# Patient Record
Sex: Female | Born: 1995 | Race: Black or African American | Hispanic: No | Marital: Single | Smoking: Never smoker
Health system: Southern US, Community
[De-identification: ages and names within clinical notes are randomized; demographics above are authoritative.]

## PROBLEM LIST (undated history)

## (undated) DIAGNOSIS — N760 Acute vaginitis: Secondary | ICD-10-CM

## (undated) DIAGNOSIS — I10 Essential (primary) hypertension: Secondary | ICD-10-CM

## (undated) DIAGNOSIS — B9689 Other specified bacterial agents as the cause of diseases classified elsewhere: Secondary | ICD-10-CM

## (undated) DIAGNOSIS — A599 Trichomoniasis, unspecified: Secondary | ICD-10-CM

## (undated) HISTORY — DX: Other specified bacterial agents as the cause of diseases classified elsewhere: B96.89

## (undated) HISTORY — DX: Other specified bacterial agents as the cause of diseases classified elsewhere: N76.0

## (undated) HISTORY — DX: Trichomoniasis, unspecified: A59.9

## (undated) HISTORY — DX: Essential (primary) hypertension: I10

## (undated) HISTORY — PX: NO PAST SURGERIES: SHX2092

---

## 2016-12-30 ENCOUNTER — Encounter (HOSPITAL_BASED_OUTPATIENT_CLINIC_OR_DEPARTMENT_OTHER): Payer: Self-pay | Admitting: *Deleted

## 2016-12-30 ENCOUNTER — Emergency Department (HOSPITAL_BASED_OUTPATIENT_CLINIC_OR_DEPARTMENT_OTHER)
Admission: EM | Admit: 2016-12-30 | Discharge: 2016-12-30 | Disposition: A | Discharge status: Home / Self Care | Payer: Self-pay | Attending: Dermatology | Admitting: Dermatology

## 2016-12-30 DIAGNOSIS — Z5321 Procedure and treatment not carried out due to patient leaving prior to being seen by health care provider: Secondary | ICD-10-CM | POA: Insufficient documentation

## 2016-12-30 DIAGNOSIS — M79644 Pain in right finger(s): Secondary | ICD-10-CM | POA: Insufficient documentation

## 2016-12-30 NOTE — ED Notes (Signed)
Pt seen driving away in her car from the dept.

## 2016-12-30 NOTE — ED Triage Notes (Signed)
Patient states she has had pain on the nail bed of the right middle finger for one week.  Started to have drainage today.

## 2021-09-22 ENCOUNTER — Other Ambulatory Visit: Payer: Self-pay

## 2021-09-22 ENCOUNTER — Encounter (HOSPITAL_BASED_OUTPATIENT_CLINIC_OR_DEPARTMENT_OTHER): Payer: Self-pay | Admitting: *Deleted

## 2021-09-22 ENCOUNTER — Emergency Department (HOSPITAL_BASED_OUTPATIENT_CLINIC_OR_DEPARTMENT_OTHER)
Admission: EM | Admit: 2021-09-22 | Discharge: 2021-09-22 | Disposition: A | Discharge status: Home / Self Care | Payer: No Typology Code available for payment source | Attending: Emergency Medicine | Admitting: Emergency Medicine

## 2021-09-22 ENCOUNTER — Emergency Department (HOSPITAL_BASED_OUTPATIENT_CLINIC_OR_DEPARTMENT_OTHER): Payer: No Typology Code available for payment source

## 2021-09-22 DIAGNOSIS — Z3A00 Weeks of gestation of pregnancy not specified: Secondary | ICD-10-CM | POA: Insufficient documentation

## 2021-09-22 DIAGNOSIS — O4690 Antepartum hemorrhage, unspecified, unspecified trimester: Secondary | ICD-10-CM

## 2021-09-22 DIAGNOSIS — B9689 Other specified bacterial agents as the cause of diseases classified elsewhere: Secondary | ICD-10-CM

## 2021-09-22 DIAGNOSIS — N760 Acute vaginitis: Secondary | ICD-10-CM | POA: Insufficient documentation

## 2021-09-22 DIAGNOSIS — O209 Hemorrhage in early pregnancy, unspecified: Secondary | ICD-10-CM | POA: Insufficient documentation

## 2021-09-22 DIAGNOSIS — N939 Abnormal uterine and vaginal bleeding, unspecified: Secondary | ICD-10-CM

## 2021-09-22 LAB — URINALYSIS, ROUTINE W REFLEX MICROSCOPIC
Bilirubin Urine: NEGATIVE
Glucose, UA: NEGATIVE mg/dL
Nitrite: NEGATIVE
Protein, ur: 100 mg/dL — AB
Specific Gravity, Urine: 1.03 (ref 1.005–1.030)
pH: 6 (ref 5.0–8.0)

## 2021-09-22 LAB — COMPREHENSIVE METABOLIC PANEL
ALT: 12 U/L (ref 0–44)
AST: 14 U/L — ABNORMAL LOW (ref 15–41)
Albumin: 3.8 g/dL (ref 3.5–5.0)
Alkaline Phosphatase: 47 U/L (ref 38–126)
Anion gap: 5 (ref 5–15)
BUN: 11 mg/dL (ref 6–20)
CO2: 26 mmol/L (ref 22–32)
Calcium: 8.3 mg/dL — ABNORMAL LOW (ref 8.9–10.3)
Chloride: 108 mmol/L (ref 98–111)
Creatinine, Ser: 0.84 mg/dL (ref 0.44–1.00)
GFR, Estimated: >60 mL/min (ref >60)
Glucose, Bld: 87 mg/dL (ref 70–99)
Potassium: 3.4 mmol/L — ABNORMAL LOW (ref 3.5–5.1)
Sodium: 139 mmol/L (ref 135–145)
Total Bilirubin: 0.3 mg/dL (ref 0.3–1.2)
Total Protein: 7.3 g/dL (ref 6.5–8.1)

## 2021-09-22 LAB — CBC WITH DIFFERENTIAL/PLATELET
Abs Immature Granulocytes: 0.03 10*3/uL (ref 0.00–0.07)
Basophils Absolute: 0 10*3/uL (ref 0.0–0.1)
Basophils Relative: 0 %
Eosinophils Absolute: 0.2 10*3/uL (ref 0.0–0.5)
Eosinophils Relative: 2 %
HCT: 37.2 % (ref 36.0–46.0)
Hemoglobin: 12.4 g/dL (ref 12.0–15.0)
Immature Granulocytes: 0 %
Lymphocytes Relative: 35 %
Lymphs Abs: 2.9 10*3/uL (ref 0.7–4.0)
MCH: 28.1 pg (ref 26.0–34.0)
MCHC: 33.3 g/dL (ref 30.0–36.0)
MCV: 84.4 fL (ref 80.0–100.0)
Monocytes Absolute: 0.6 10*3/uL (ref 0.1–1.0)
Monocytes Relative: 7 %
Neutro Abs: 4.7 10*3/uL (ref 1.7–7.7)
Neutrophils Relative %: 56 %
Platelets: 280 10*3/uL (ref 150–400)
RBC: 4.41 MIL/uL (ref 3.87–5.11)
RDW: 13.1 % (ref 11.5–15.5)
WBC: 8.4 10*3/uL (ref 4.0–10.5)
nRBC: 0 % (ref 0.0–0.2)

## 2021-09-22 LAB — PREGNANCY, URINE: Preg Test, Ur: POSITIVE — AB

## 2021-09-22 LAB — URINALYSIS, MICROSCOPIC (REFLEX): RBC / HPF: >50 RBC/hpf (ref 0–5)

## 2021-09-22 LAB — HCG, QUANTITATIVE, PREGNANCY: hCG, Beta Chain, Quant, S: 543 m[IU]/mL — ABNORMAL HIGH (ref <5)

## 2021-09-22 LAB — ABO/RH: ABO/RH(D): B POS

## 2021-09-22 LAB — WET PREP, GENITAL
Sperm: NONE SEEN
Trich, Wet Prep: NONE SEEN
Yeast Wet Prep HPF POC: NONE SEEN

## 2021-09-22 MED ORDER — METRONIDAZOLE 500 MG PO TABS: 500.0000 mg | ORAL_TABLET | Freq: Two times a day (BID) | ORAL | 0 refills | Status: AC

## 2021-09-22 NOTE — ED Notes (Signed)
ED Provider at bedside. 

## 2021-09-22 NOTE — Discharge Instructions (Addendum)
You were seen in the emergency department a day for vaginal bleeding.  While you are here you had a positive pregnancy test.  Your hCG which is a indication of how far along you are in pregnancy showed that you are anywhere between 3 and [redacted] weeks pregnant.  We obtained an ultrasound which did not show an intrauterine pregnancy but this is not abnormal given the hCG level.  I spoke with OB/GYN who instructs that you return to OB/GYN on Monday to have a repeat beta-hCG drawn.  It is imperative that you go to this appointment.  I have put the name of your current OB/GYN Annye Rusk and your discharge paperwork for you to call.  As we discussed at the bedside its important for you return to emergency department if you have lightheadedness or dizziness, shortness of breath with this ongoing bleeding.  Please also return to the emergency department if you are soaking through more than 1 pad an hour for a few hours while at home.  You may also return to emergency department for any reason.

## 2021-09-22 NOTE — ED Provider Notes (Signed)
[facility] EMERGENCY DEPARTMENT Provider Note   CSN: [id number] Arrival date & time: 09/22/21  1523     History Chief Complaint  Patient presents with   Vaginal Bleeding    Joy Jordan is a 25 y.o. female.  No significant past medical history presents emergency department with vaginal bleeding.  States that she had pink spotting on 09/03/2021.  States that she started her period the following week of 09/10/2021.  She is unsure about which she states she started last week.  She states that her periods normally are 7 to 10 days however she is had continued bleeding.  She states that on Monday she began having bad abdominal cramping.  States that she took ibuprofen with relief of symptoms.  She states then on Tuesday she had a few large blood clots followed by smaller blood clots which resolved on Wednesday and then recurred on Thursday.  States that her last menstrual period before this was in August.  States that her last intercourse was at the beginning of August.  She also endorses having odor from her vagina which is new as of the past few days.  States that she is currently using about 6 pads a day.  She denies current abdominal pain or cramping, lightheadedness, dizziness, fevers, nausea or vomiting.  No prior pregnancies.   Vaginal Bleeding Associated symptoms: no abdominal pain, no dizziness, no dysuria and no fever       History reviewed. No pertinent past medical history.  There are no problems to display for this patient.   History reviewed. No pertinent surgical history.   OB History   No obstetric history on file.     No family history on file.  Social History   Tobacco Use   Smoking status: Never   Smokeless tobacco: Never  Substance Use Topics   Alcohol use: No   Drug use: No    Home Medications Prior to Admission medications   Not on File    Allergies    Patient has no known allergies.  Review of Systems   Review of Systems   Constitutional:  Negative for fever.  Gastrointestinal:  Negative for abdominal pain and vomiting.  Genitourinary:  Positive for pelvic pain and vaginal bleeding. Negative for dysuria.  Neurological:  Negative for dizziness and light-headedness.  Hematological:  Does not bruise/bleed easily.  All other systems reviewed and are negative.  Physical Exam Updated Vital Signs BP 139/73 (BP Location: Right Arm)   Pulse 97   Temp 98.4 F (36.9 C) (Oral)   Resp 18   Ht 5\' 3"  (1.6 m)   Wt 131.5 kg   LMP 09/15/2021   SpO2 100%   BMI 51.37 kg/m   Physical Exam Vitals and nursing note reviewed. Exam conducted with a chaperone present.  Constitutional:      General: She is not in acute distress.    Appearance: Normal appearance. She is not toxic-appearing.  HENT:     Head: Normocephalic and atraumatic.  Eyes:     General: No scleral icterus.    Extraocular Movements: Extraocular movements intact.     Pupils: Pupils are equal, round, and reactive to light.  Cardiovascular:     Rate and Rhythm: Regular rhythm. Tachycardia present.     Pulses: Normal pulses.     Heart sounds: Normal heart sounds. No murmur heard. Pulmonary:     Effort: Pulmonary effort is normal. No respiratory distress.     Breath sounds: Normal breath sounds.  Abdominal:     General: Bowel sounds are normal.     Palpations: Abdomen is soft.     Tenderness: There is no abdominal tenderness.  Genitourinary:    General: Normal vulva.     Exam position: Lithotomy position.     Vagina: Bleeding present. No erythema, tenderness or lesions.     Cervix: Cervical bleeding present. No lesion.  Musculoskeletal:     Cervical back: Normal range of motion.     Right lower leg: No edema.     Left lower leg: No edema.  Skin:    General: Skin is warm and dry.     Capillary Refill: Capillary refill takes less than 2 seconds.  Neurological:     General: No focal deficit present.     Mental Status: She is alert and oriented  to person, place, and time. Mental status is at baseline.  Psychiatric:        Mood and Affect: Mood normal.        Behavior: Behavior normal.    ED Results / Procedures / Treatments   Labs (all labs ordered are listed, but only abnormal results are displayed) Labs Reviewed  WET PREP, GENITAL - Abnormal; Notable for the following components:      Result Value   Clue Cells Wet Prep HPF POC PRESENT (*)    WBC, Wet Prep HPF POC FEW (*)    All other components within normal limits  PREGNANCY, URINE - Abnormal; Notable for the following components:   Preg Test, Ur POSITIVE (*)    All other components within normal limits  COMPREHENSIVE METABOLIC PANEL - Abnormal; Notable for the following components:   Potassium 3.4 (*)    Calcium 8.3 (*)    AST 14 (*)    All other components within normal limits  HCG, QUANTITATIVE, PREGNANCY - Abnormal; Notable for the following components:   hCG, Beta Chain, Quant, S 543 (*)    All other components within normal limits  URINALYSIS, ROUTINE W REFLEX MICROSCOPIC - Abnormal; Notable for the following components:   Color, Urine RED (*)    APPearance CLOUDY (*)    Hgb urine dipstick LARGE (*)    Ketones, ur TRACE (*)    Protein, ur 100 (*)    Leukocytes,Ua SMALL (*)    All other components within normal limits  URINALYSIS, MICROSCOPIC (REFLEX) - Abnormal; Notable for the following components:   Bacteria, UA MANY (*)    All other components within normal limits  CBC WITH DIFFERENTIAL/PLATELET  ABO/RH  GC/CHLAMYDIA PROBE AMP ([place]) NOT AT [facility]   EKG None  Radiology US OB LESS THAN 14 WEEKS WITH OB TRANSVAGINAL  Result Date: 09/22/2021 CLINICAL DATA:  Vaginal bleeding and cramping for 1 week, beta HCG 543 EXAM: OBSTETRIC <14 WK [place] AND TRANSVAGINAL OB US TECHNIQUE: Both transabdominal and transvaginal ultrasound examinations were performed for complete evaluation of the gestation as well as the maternal uterus, adnexal regions, and pelvic  cul-de-sac. Transvaginal technique was performed to assess early pregnancy. COMPARISON:  None. FINDINGS: Intrauterine gestational sac: None Yolk sac:  Not Visualized. Embryo:  Not Visualized. Maternal uterus/adnexae: The uterus is anteverted without gross abnormality. Endometrium measures 12 mm in thickness. Ovaries are not well visualized. No free fluid. IMPRESSION: 1. No evidence of intrauterine pregnancy at this time, not unexpected given low beta HCG level. Serial beta HCG measurements and follow-up ultrasound may be needed to document a live intrauterine pregnancy. 2. Otherwise unremarkable exam. Electronically Signed  By: Sharlet Salina M.D.   On: 09/22/2021 18:57    Procedures Procedures   Medications Ordered in ED Medications - No data to display  ED Course  I have reviewed the triage vital signs and the nursing notes.  Pertinent labs & imaging results that were available during my care of the patient were reviewed by me and considered in my medical decision making (see chart for details).    MDM Rules/Calculators/A&P 25 year old female presents emergency department with vaginal bleeding.  Initial urine pregnancy positive. Beta-hCG 543. Obtain transvaginal ultrasound which showed no evidence of intrauterine pregnancy at this time.  This is not unexpected given hCG level.  CBC with hemoglobin of 12.4, only previous hemoglobin was 2 years ago which was 11.8.  However she is not hemodynamically unstable. Pelvic exam with blood in the vaginal vault.  Cervical os closed.  Swabbed for GC/chlamydia and wet prep.  Wet prep with clue cells so will treat for bacterial vaginosis. Rh+ so will not give RhoGAM as it is not indicated. Spoke with Dr. Para March with [facility] who advises that she follow-up with her OB/GYN on Monday for repeat beta-hCG measurement. I have discussed all the findings with the patient and answered all of her questions at this time I advised her that if  she is soaking through more than 1 pad an hour for the few hours to represent to the emergency department.  She is also advised to present to the emergency department if she begins to have lightheadedness or dizziness, shortness of breath with her ongoing bleeding.    At this time she is safe for discharge.  She has strict follow-up precautions to the emergency department and OB/GYN. Final Clinical Impression(s) / ED Diagnoses Final diagnoses:  BV (bacterial vaginosis)  Vaginal bleeding in pregnancy    Rx / DC Orders ED Discharge Orders          Ordered    metroNIDAZOLE (FLAGYL) 500 MG tablet  2 times daily        09/22/21 1953             Cristopher Peru, PA-C 09/22/21 Salome Holmes, MD 09/25/21 1332

## 2021-09-22 NOTE — ED Triage Notes (Signed)
Vaginal bleeding and cramping for a week. She is not sure if this is an abnormal menses for her or if she could be pregnant and having a miscarriage.

## 2021-09-25 LAB — GC/CHLAMYDIA PROBE AMP (~~LOC~~) NOT AT ARMC
Chlamydia: NEGATIVE
Comment: NEGATIVE
Comment: NORMAL
Neisseria Gonorrhea: NEGATIVE

## 2022-06-13 ENCOUNTER — Encounter: Payer: Medicaid Other | Admitting: Family Medicine

## 2022-06-20 ENCOUNTER — Encounter: Payer: Self-pay | Admitting: General Practice

## 2022-06-21 ENCOUNTER — Encounter: Payer: Self-pay | Admitting: General Practice

## 2022-09-28 IMAGING — US US OB < 14 WEEKS - US OB TV
1 series · 14 of 28 positions shown · non-contrast
Comparison: None.

CLINICAL DATA: Vaginal bleeding and cramping for 1 week, beta HCG
543

EXAM:
OBSTETRIC <14 WK US AND TRANSVAGINAL OB US
TECHNIQUE: Both transabdominal and transvaginal ultrasound examinations were
performed for complete evaluation of the gestation as well as the
maternal uterus, adnexal regions, and pelvic cul-de-sac.
Transvaginal technique was performed to assess early pregnancy.

[Series 1: us ob < 14 weeks - us ob tv · 45 acquisitions, 14 frames shown]
[im 2/45]
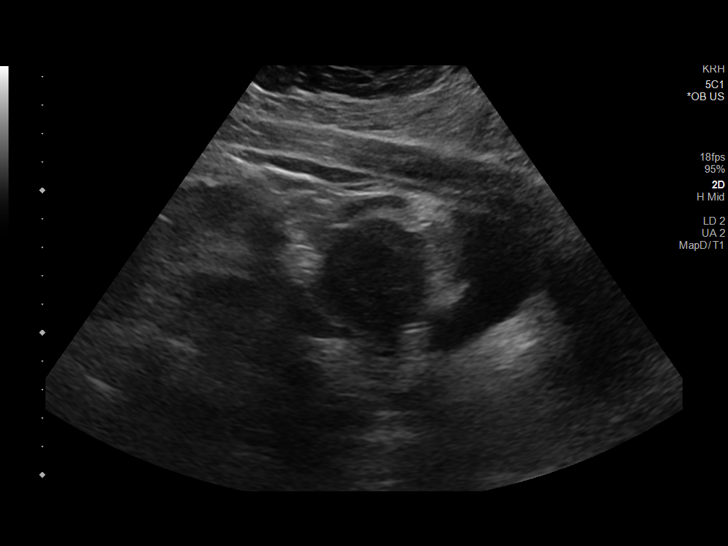
[im 5/45]
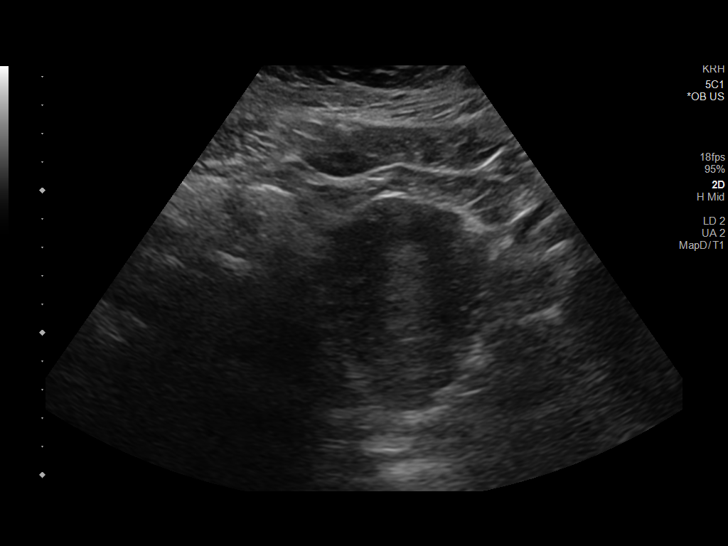
[im 9/45]
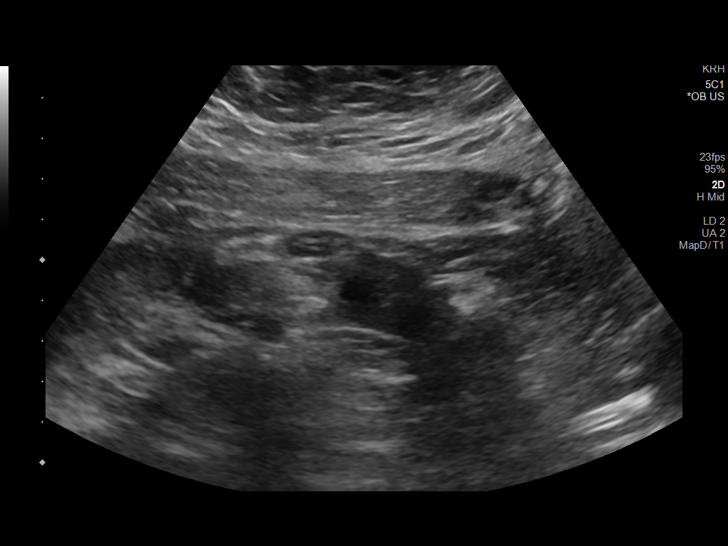
[im 12/45]
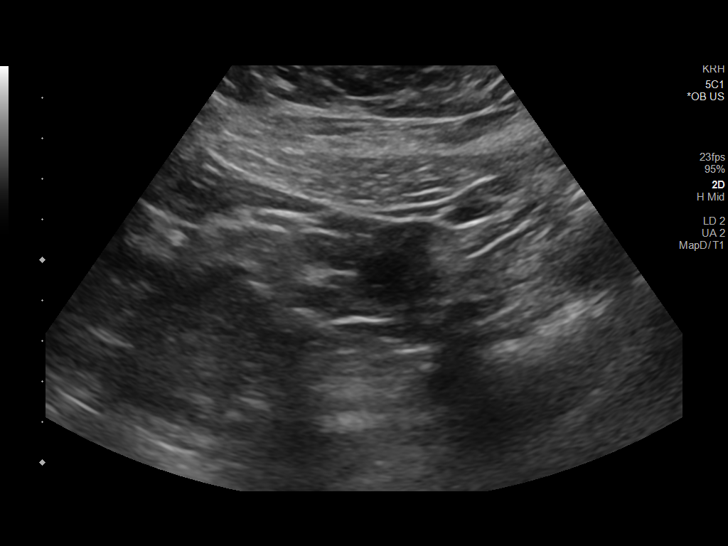
[im 15/45]
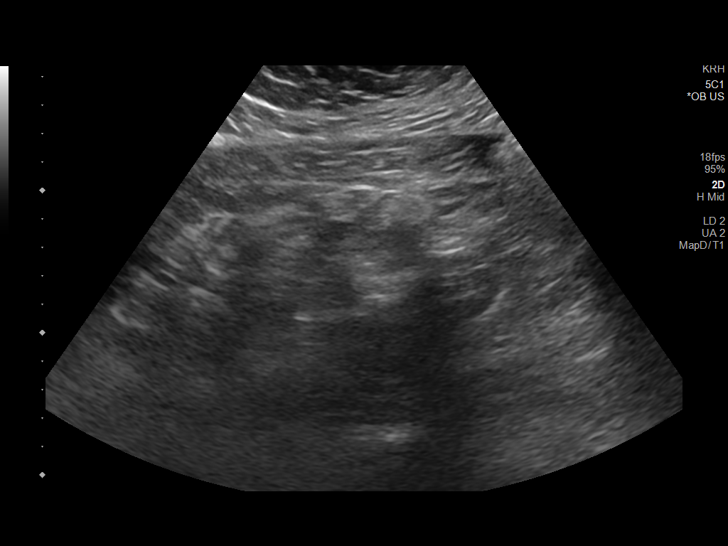
[im 18/45]
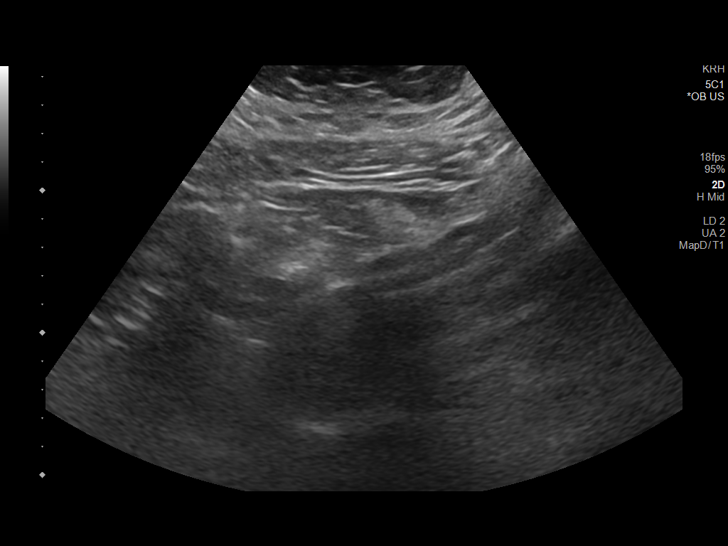
[im 22/45]
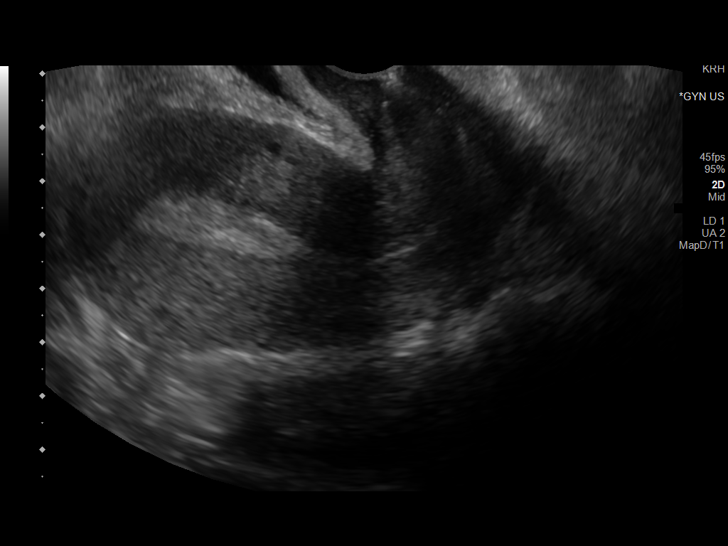
[im 25/45]
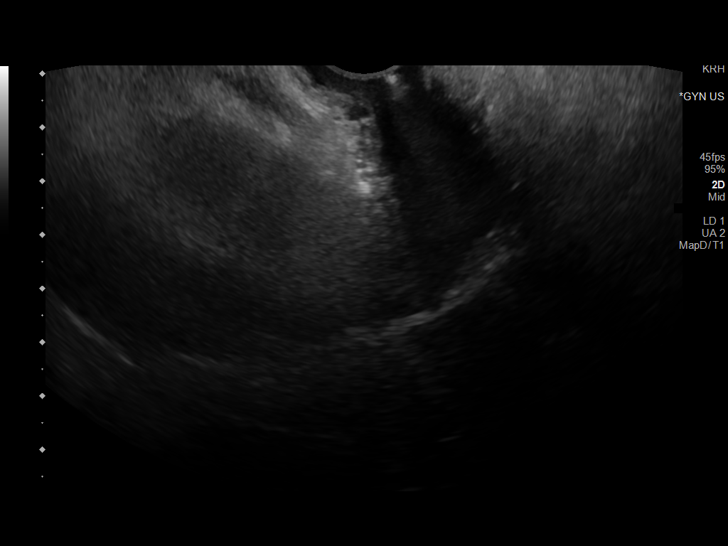
[im 28/45]
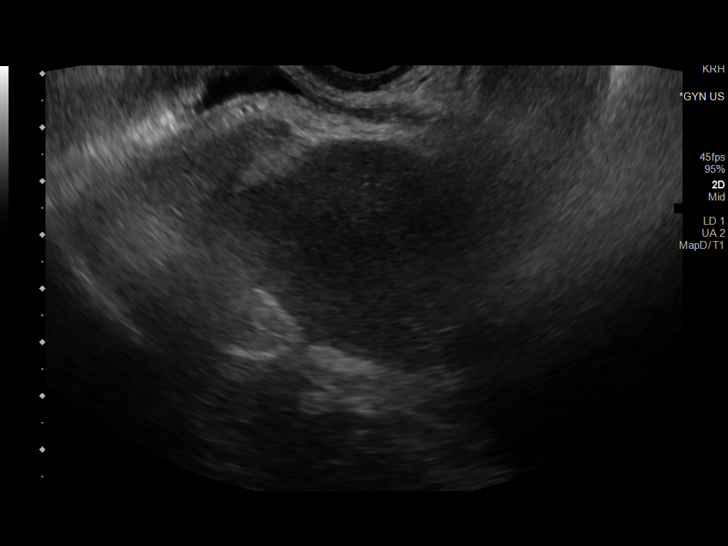
[im 31/45]
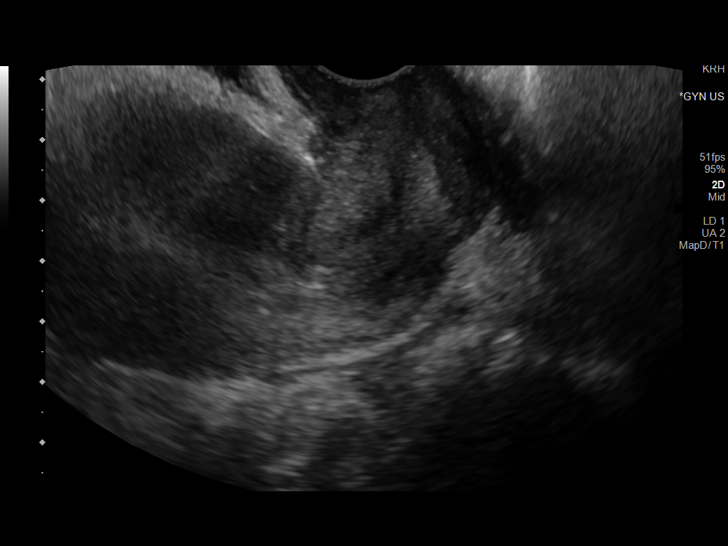
[im 35/45]
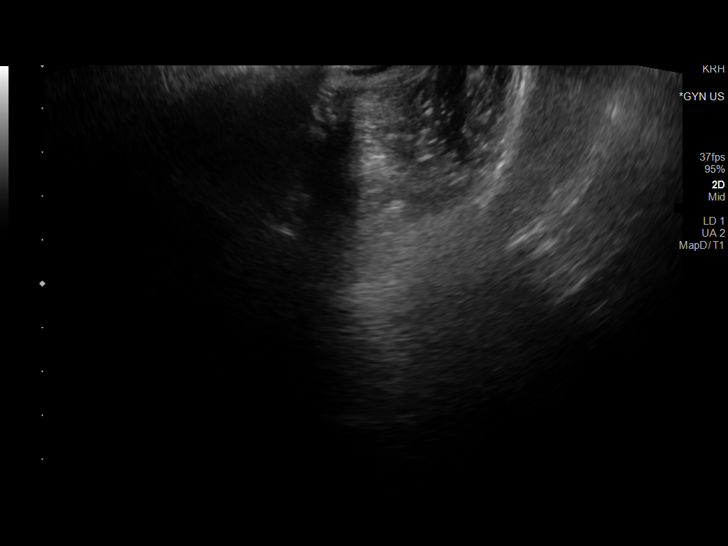
[im 38/45]
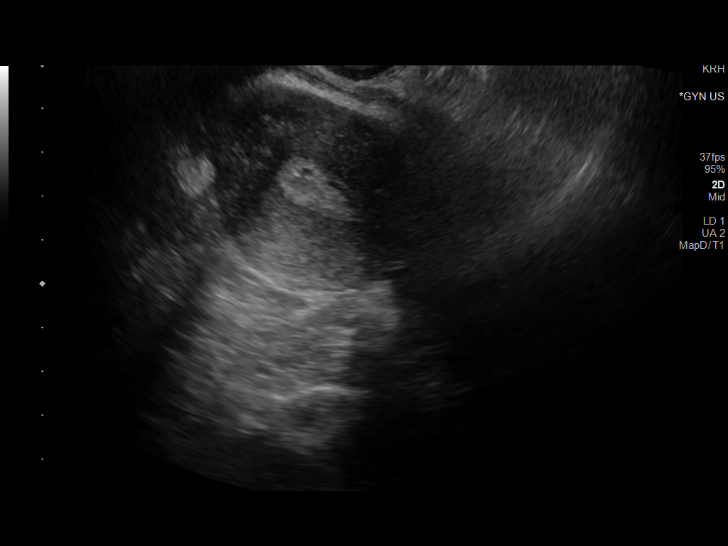
[im 41/45]
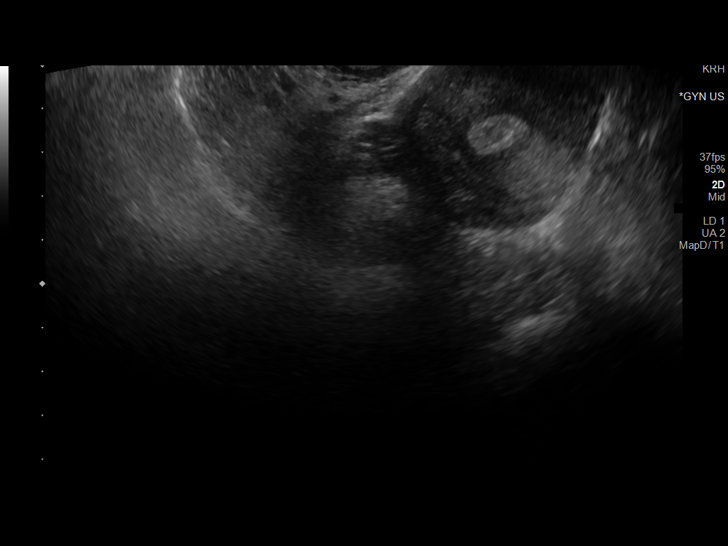
[im 45/45]
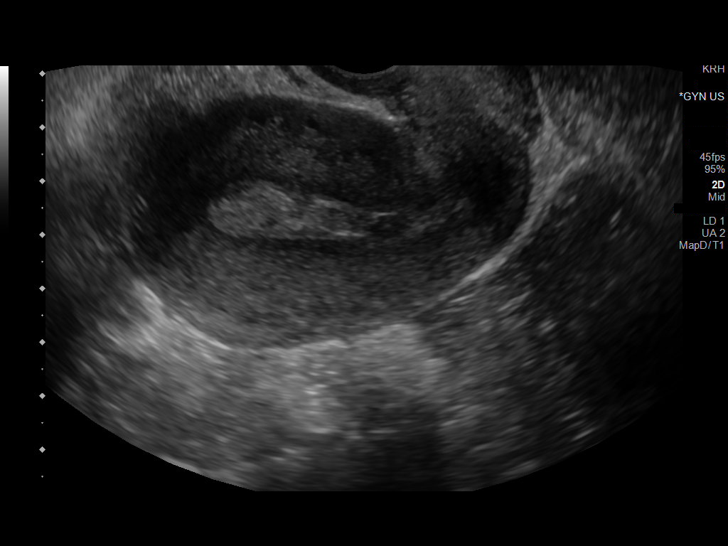

[14 of 28 positions shown; findings below may reference images not displayed]

FINDINGS: Intrauterine gestational sac: None

Yolk sac:  Not Visualized.

Embryo:  Not Visualized.

Maternal uterus/adnexae: The uterus is anteverted without gross
abnormality. Endometrium measures 12 mm in thickness. Ovaries are
not well visualized. No free fluid.
IMPRESSION: 1. No evidence of intrauterine pregnancy at this time, not
unexpected given low beta HCG level. Serial beta HCG measurements
and follow-up ultrasound may be needed to document a live
intrauterine pregnancy.
2. Otherwise unremarkable exam.

## 2022-10-01 LAB — OB RESULTS CONSOLE HEPATITIS B SURFACE ANTIGEN: Hepatitis B Surface Ag: NEGATIVE

## 2022-10-01 LAB — HEPATITIS C ANTIBODY: HCV Ab: NEGATIVE

## 2022-10-01 LAB — OB RESULTS CONSOLE HIV ANTIBODY (ROUTINE TESTING): HIV: NONREACTIVE

## 2022-10-01 LAB — OB RESULTS CONSOLE RUBELLA ANTIBODY, IGM: Rubella: IMMUNE

## 2022-10-10 LAB — OB RESULTS CONSOLE RPR: RPR: NONREACTIVE

## 2022-12-24 NOTE — L&D Delivery Note (Signed)
Operative Delivery Note At 12:53 AM a viable and healthy female was delivered via Vaginal, Vacuum Investment banker, operational).  Presentation: vertex; Position: Left,, Occiput,, Anterior; Station: +2.  Verbal consent: obtained from patient.  Risks and benefits discussed in detail.  Risks include, but are not limited to the risks of anesthesia, bleeding, infection, damage to maternal tissues, fetal cephalhematoma.  There is also the risk of inability to effect vaginal delivery of the head, or shoulder dystocia that cannot be resolved by established maneuvers, leading to the need for emergency cesarean section.  Indication: repetitive deep variable deceleration APGAR: 8, 9; weight pending .   Placenta status:spontaneous intact not sent , .   Cord:  with the following complications: .  Cord pH: n/a  Anesthesia:  epidural Instruments: mushroom vacuum Episiotomy: None Lacerations: 2nd degree;Sulcus;Perineal; bilateral Vaginal Suture Repair: 3.0 chromic Est. Blood Loss (mL): 1064  Mom to postpartum.  Baby to Couplet care / Skin to Skin.  Macedonio Scallon A Mariell Nester 04/23/2023, 1:49 AM

## 2023-02-21 ENCOUNTER — Other Ambulatory Visit: Payer: Self-pay | Admitting: Obstetrics and Gynecology

## 2023-02-21 DIAGNOSIS — Z363 Encounter for antenatal screening for malformations: Secondary | ICD-10-CM

## 2023-02-22 ENCOUNTER — Encounter: Payer: Self-pay | Admitting: *Deleted

## 2023-02-25 ENCOUNTER — Ambulatory Visit: Payer: BC Managed Care – PPO | Admitting: *Deleted

## 2023-02-25 ENCOUNTER — Encounter: Payer: Self-pay | Admitting: *Deleted

## 2023-02-25 ENCOUNTER — Ambulatory Visit: Payer: BC Managed Care – PPO | Attending: Obstetrics and Gynecology

## 2023-02-25 VITALS — BP 121/83 | HR 116

## 2023-02-25 DIAGNOSIS — O10913 Unspecified pre-existing hypertension complicating pregnancy, third trimester: Secondary | ICD-10-CM | POA: Diagnosis present

## 2023-02-25 DIAGNOSIS — Z363 Encounter for antenatal screening for malformations: Secondary | ICD-10-CM | POA: Insufficient documentation

## 2023-03-28 LAB — OB RESULTS CONSOLE GC/CHLAMYDIA
Chlamydia: NEGATIVE
Neisseria Gonorrhea: NEGATIVE

## 2023-04-02 LAB — OB RESULTS CONSOLE GBS: GBS: NEGATIVE

## 2023-04-22 ENCOUNTER — Encounter (HOSPITAL_COMMUNITY): Payer: Self-pay | Admitting: Obstetrics and Gynecology

## 2023-04-22 ENCOUNTER — Inpatient Hospital Stay (HOSPITAL_COMMUNITY): Payer: BC Managed Care – PPO | Admitting: Anesthesiology

## 2023-04-22 ENCOUNTER — Inpatient Hospital Stay (HOSPITAL_COMMUNITY)
Admission: AD | Admit: 2023-04-22 | Discharge: 2023-04-25 | DRG: 807 | Disposition: A | Discharge status: Home / Self Care | Payer: BC Managed Care – PPO | Attending: Obstetrics and Gynecology | Admitting: Obstetrics and Gynecology

## 2023-04-22 ENCOUNTER — Other Ambulatory Visit: Payer: Self-pay

## 2023-04-22 DIAGNOSIS — O9902 Anemia complicating childbirth: Principal | ICD-10-CM | POA: Diagnosis present

## 2023-04-22 DIAGNOSIS — Z3A38 38 weeks gestation of pregnancy: Secondary | ICD-10-CM | POA: Diagnosis not present

## 2023-04-22 DIAGNOSIS — D509 Iron deficiency anemia, unspecified: Secondary | ICD-10-CM | POA: Diagnosis present

## 2023-04-22 DIAGNOSIS — O99214 Obesity complicating childbirth: Secondary | ICD-10-CM | POA: Diagnosis present

## 2023-04-22 DIAGNOSIS — O26893 Other specified pregnancy related conditions, third trimester: Secondary | ICD-10-CM | POA: Diagnosis present

## 2023-04-22 LAB — URINALYSIS, ROUTINE W REFLEX MICROSCOPIC
Bilirubin Urine: NEGATIVE
Glucose, UA: NEGATIVE mg/dL
Ketones, ur: NEGATIVE mg/dL
Nitrite: NEGATIVE
Protein, ur: 30 mg/dL — AB
Specific Gravity, Urine: 1.01 (ref 1.005–1.030)
pH: 7 (ref 5.0–8.0)

## 2023-04-22 LAB — CBC
HCT: 29.5 % — ABNORMAL LOW (ref 36.0–46.0)
Hemoglobin: 9.5 g/dL — ABNORMAL LOW (ref 12.0–15.0)
MCH: 25.3 pg — ABNORMAL LOW (ref 26.0–34.0)
MCHC: 32.2 g/dL (ref 30.0–36.0)
MCV: 78.5 fL — ABNORMAL LOW (ref 80.0–100.0)
Platelets: 282 10*3/uL (ref 150–400)
RBC: 3.76 MIL/uL — ABNORMAL LOW (ref 3.87–5.11)
RDW: 14.3 % (ref 11.5–15.5)
WBC: 12 10*3/uL — ABNORMAL HIGH (ref 4.0–10.5)
nRBC: 0.2 % (ref 0.0–0.2)

## 2023-04-22 LAB — TYPE AND SCREEN
ABO/RH(D): B POS
Antibody Screen: NEGATIVE

## 2023-04-22 LAB — POCT FERN TEST: POCT Fern Test: POSITIVE

## 2023-04-22 LAB — RPR: RPR Ser Ql: NONREACTIVE

## 2023-04-22 MED ORDER — OXYTOCIN 10 UNIT/ML IJ SOLN: 10.0000 [IU] | Freq: Once | INTRAMUSCULAR | Status: DC

## 2023-04-22 MED ORDER — EPHEDRINE 5 MG/ML INJ: 10.0000 mg | INTRAVENOUS | Status: DC | PRN

## 2023-04-22 MED ORDER — SOD CITRATE-CITRIC ACID 500-334 MG/5ML PO SOLN: 30.0000 mL | ORAL | Status: DC | PRN

## 2023-04-22 MED ORDER — LIDOCAINE HCL (PF) 1 % IJ SOLN
30.0000 mL | INTRAMUSCULAR | Status: AC | PRN
  Administered 2023-04-23: 30 mL via SUBCUTANEOUS
  Filled 2023-04-22: qty 30

## 2023-04-22 MED ORDER — LIDOCAINE HCL (PF) 1 % IJ SOLN
INTRAMUSCULAR | Status: DC | PRN
  Administered 2023-04-22 (×2): 5 mL via EPIDURAL

## 2023-04-22 MED ORDER — PHENYLEPHRINE 80 MCG/ML (10ML) SYRINGE FOR IV PUSH (FOR BLOOD PRESSURE SUPPORT)
80.0000 ug | PREFILLED_SYRINGE | INTRAVENOUS | Status: DC | PRN
  Filled 2023-04-22: qty 10

## 2023-04-22 MED ORDER — OXYTOCIN BOLUS FROM INFUSION
333.0000 mL | Freq: Once | INTRAVENOUS | Status: AC
  Administered 2023-04-23: 333 mL via INTRAVENOUS

## 2023-04-22 MED ORDER — LACTATED RINGERS IV SOLN
500.0000 mL | Freq: Once | INTRAVENOUS | Status: AC
  Administered 2023-04-22: 500 mL via INTRAVENOUS

## 2023-04-22 MED ORDER — LACTATED RINGERS IV SOLN: 500.0000 mL | INTRAVENOUS | Status: DC | PRN

## 2023-04-22 MED ORDER — MISOPROSTOL 50MCG HALF TABLET
50.0000 ug | ORAL_TABLET | Freq: Once | ORAL | Status: AC
  Administered 2023-04-22: 50 ug via ORAL
  Filled 2023-04-22: qty 1

## 2023-04-22 MED ORDER — TERBUTALINE SULFATE 1 MG/ML IJ SOLN
0.2500 mg | Freq: Once | INTRAMUSCULAR | Status: AC | PRN
  Administered 2023-04-22: 0.25 mg via SUBCUTANEOUS
  Filled 2023-04-22: qty 1

## 2023-04-22 MED ORDER — OXYTOCIN-SODIUM CHLORIDE 30-0.9 UT/500ML-% IV SOLN
1.0000 m[IU]/min | INTRAVENOUS | Status: DC
  Administered 2023-04-22: 2 m[IU]/min via INTRAVENOUS
  Filled 2023-04-22: qty 500

## 2023-04-22 MED ORDER — FENTANYL CITRATE (PF) 100 MCG/2ML IJ SOLN
100.0000 ug | INTRAMUSCULAR | Status: DC | PRN
  Administered 2023-04-22: 100 ug via INTRAVENOUS
  Filled 2023-04-22: qty 2

## 2023-04-22 MED ORDER — OXYCODONE-ACETAMINOPHEN 5-325 MG PO TABS: 2.0000 | ORAL_TABLET | ORAL | Status: DC | PRN

## 2023-04-22 MED ORDER — MISOPROSTOL 25 MCG QUARTER TABLET
25.0000 ug | ORAL_TABLET | Freq: Once | ORAL | Status: AC
  Administered 2023-04-22: 25 ug via VAGINAL
  Filled 2023-04-22: qty 1

## 2023-04-22 MED ORDER — TERBUTALINE SULFATE 1 MG/ML IJ SOLN: 0.2500 mg | Freq: Once | INTRAMUSCULAR | Status: DC | PRN

## 2023-04-22 MED ORDER — LACTATED RINGERS AMNIOINFUSION
INTRAVENOUS | Status: DC
  Administered 2023-04-22: 150 mL/h via INTRAUTERINE

## 2023-04-22 MED ORDER — DIPHENHYDRAMINE HCL 50 MG/ML IJ SOLN: 12.5000 mg | INTRAMUSCULAR | Status: DC | PRN

## 2023-04-22 MED ORDER — OXYCODONE-ACETAMINOPHEN 5-325 MG PO TABS: 1.0000 | ORAL_TABLET | ORAL | Status: DC | PRN

## 2023-04-22 MED ORDER — PHENYLEPHRINE 80 MCG/ML (10ML) SYRINGE FOR IV PUSH (FOR BLOOD PRESSURE SUPPORT): 80.0000 ug | PREFILLED_SYRINGE | INTRAVENOUS | Status: DC | PRN

## 2023-04-22 MED ORDER — OXYTOCIN-SODIUM CHLORIDE 30-0.9 UT/500ML-% IV SOLN
2.5000 [IU]/h | INTRAVENOUS | Status: DC
  Administered 2023-04-23: 2.5 [IU]/h via INTRAVENOUS

## 2023-04-22 MED ORDER — MISOPROSTOL 50MCG HALF TABLET
50.0000 ug | ORAL_TABLET | ORAL | Status: DC | PRN
  Administered 2023-04-22: 50 ug via ORAL
  Filled 2023-04-22: qty 1

## 2023-04-22 MED ORDER — LACTATED RINGERS IV SOLN: INTRAVENOUS | Status: DC

## 2023-04-22 MED ORDER — ACETAMINOPHEN 325 MG PO TABS: 650.0000 mg | ORAL_TABLET | ORAL | Status: DC | PRN

## 2023-04-22 MED ORDER — ONDANSETRON HCL 4 MG/2ML IJ SOLN: 4.0000 mg | Freq: Four times a day (QID) | INTRAMUSCULAR | Status: DC | PRN

## 2023-04-22 MED ORDER — FENTANYL-BUPIVACAINE-NACL 0.5-0.125-0.9 MG/250ML-% EP SOLN
12.0000 mL/h | EPIDURAL | Status: DC | PRN
  Administered 2023-04-22 (×2): 12 mL/h via EPIDURAL
  Filled 2023-04-22 (×2): qty 250

## 2023-04-22 NOTE — Anesthesia Procedure Notes (Signed)
Epidural Patient location during procedure: OB Start time: 04/22/2023 11:41 AM End time: 04/22/2023 11:49 AM  Staffing Anesthesiologist: Mal Amabile, MD Performed: anesthesiologist   Preanesthetic Checklist Completed: patient identified, IV checked, site marked, risks and benefits discussed, surgical consent, monitors and equipment checked, pre-op evaluation and timeout performed  Epidural Patient position: sitting Prep: DuraPrep and site prepped and draped Patient monitoring: continuous pulse ox and blood pressure Approach: midline Location: L3-L4 Injection technique: LOR air  Needle:  Needle type: Tuohy  Needle gauge: 17 G Needle length: 9 cm and 9 Needle insertion depth: 8 cm Catheter type: closed end flexible Catheter size: 19 Gauge Catheter at skin depth: 14 cm Test dose: negative and Other  Assessment Events: blood not aspirated, no cerebrospinal fluid, injection not painful, no injection resistance, no paresthesia and negative IV test  Additional Notes Patient identified. Risks and benefits discussed including failed block, incomplete  Pain control, post dural puncture headache, nerve damage, paralysis, blood pressure Changes, nausea, vomiting, reactions to medications-both toxic and allergic and post Partum back pain. All questions were answered. Patient expressed understanding and wished to proceed. Sterile technique was used throughout procedure. Epidural site was Dressed with sterile barrier dressing. No paresthesias, signs of intravascular injection Or signs of intrathecal spread were encountered.  Patient was more comfortable after the epidural was dosed. Please see RN's note for documentation of vital signs and FHR which are stable. Reason for block:procedure for pain

## 2023-04-22 NOTE — Progress Notes (Signed)
Called by RN regarding late decelerations. Pt just had IUPC placed. No pitocin. Tracing reviewed: baseline 140 (+) decelerations suggestive of variable decelerations. (+) SROM. [place] terb given. Will start amnioinfusion and watch fetal tracing closely

## 2023-04-22 NOTE — Anesthesia Preprocedure Evaluation (Signed)
Anesthesia Evaluation  Patient identified by MRN, date of birth, ID band Patient awake    Reviewed: Allergy & Precautions, Patient's Chart, lab work & pertinent test results  Airway Mallampati: III       Dental no notable dental hx. (+) Teeth Intact   Pulmonary neg pulmonary ROS   Pulmonary exam normal breath sounds clear to auscultation       Cardiovascular hypertension, Normal cardiovascular exam Rhythm:Regular Rate:Normal     Neuro/Psych negative neurological ROS  negative psych ROS   GI/Hepatic Neg liver ROS,GERD  ,,  Endo/Other    Morbid obesity  Renal/GU negative Renal ROS  negative genitourinary   Musculoskeletal negative musculoskeletal ROS (+)    Abdominal  (+) + obese  Peds  Hematology  (+) Blood dyscrasia, anemia   Anesthesia Other Findings   Reproductive/Obstetrics (+) Pregnancy                              Anesthesia Physical Anesthesia Plan  ASA: 3  Anesthesia Plan: Epidural   Post-op Pain Management:    Induction:   PONV Risk Score and Plan:   Airway Management Planned: Natural Airway  Additional Equipment:   Intra-op Plan:   Post-operative Plan:   Informed Consent: I have reviewed the patients History and Physical, chart, labs and discussed the procedure including the risks, benefits and alternatives for the proposed anesthesia with the patient or authorized representative who has indicated his/her understanding and acceptance.       Plan Discussed with: Anesthesiologist  Anesthesia Plan Comments:          Anesthesia Quick Evaluation

## 2023-04-22 NOTE — H&P (Signed)
Joy Jordan is a 27 y.o. female presenting @ 4 3/[redacted] wk gestation with SROM 3:30 am. Clear fluid. (+) FM. GBS cx neg. OB History     Gravida  2   Para      Term      Preterm      AB  1   Living  0      SAB  1   IAB      Ectopic      Multiple      Live Births             Past Medical History:  Diagnosis Date   BV (bacterial vaginosis)    Hypertension    Trichomonas infection    Past Surgical History:  Procedure Laterality Date   NO PAST SURGERIES     Family History: family history includes Cancer in her maternal grandmother. Social History:  reports that she has never smoked. She has never used smokeless tobacco. She reports that she does not drink alcohol and does not use drugs.     Maternal Diabetes: No Genetic Screening: Normal Maternal Ultrasounds/Referrals: Other: incomplete anatomy Fetal Ultrasounds or other Referrals:  Referred to Materal Fetal Medicine  Maternal Substance Abuse:  No Significant Maternal Medications:  None Significant Maternal Lab Results:  Group B Strep negative Number of Prenatal Visits:greater than 3 verified prenatal visits Other Comments:   maternal obesity  Review of Systems  All other systems reviewed and are negative.  History Dilation: 1 Effacement (%): 70 Station: -2 Exam by:: Dr Cherly Hensen Blood pressure 121/82, pulse 90, temperature 97.9 F (36.6 C), resp. rate 20, height 5\' 3"  (1.6 m), weight (!) 138.2 kg, last menstrual period 07/27/2022, SpO2 99 %. Maternal Exam:  Introitus: Normal vulva.   Physical Exam Constitutional:      Appearance: Normal appearance. She is obese.  HENT:     Head: Atraumatic.     Mouth/Throat:     Mouth: Mucous membranes are dry.  Eyes:     Extraocular Movements: Extraocular movements intact.  Cardiovascular:     Rate and Rhythm: Regular rhythm.     Heart sounds: Normal heart sounds.  Pulmonary:     Breath sounds: Normal breath sounds.  Abdominal:     Comments: Gravid large  pannus  Genitourinary:    General: Normal vulva.  Musculoskeletal:        General: No swelling.     Cervical back: Neck supple.  Skin:    General: Skin is warm and dry.  Neurological:     Mental Status: She is alert and oriented to person, place, and time.  Psychiatric:        Mood and Affect: Mood normal.        Behavior: Behavior normal.        Judgment: Judgment normal.     Prenatal labs: ABO, Rh: --/--/B POS (04/29 0455) Antibody: NEG (04/29 0455) Rubella: Immune (10/09 0000) RPR: NON REACTIVE (04/29 0455)  HBsAg: Negative (10/09 0000)  HIV: Non-reactive (10/09 0000)  GBS:   negative  Assessment/Plan: SROM Latent phase Morbid obesity IUP @ 38 3/7 wk P) admit routine labs. Oral and vaginal cytotec. Analgesic prn   Joy Jordan 04/22/2023, 8:51 AM

## 2023-04-22 NOTE — Progress Notes (Signed)
S: comfortable S/p terbutaline O: BP (!) 129/50   Pulse (!) 147   Temp 98.2 F (36.8 C) (Oral)   Resp 20   Ht 5\' 3"  (1.6 m)   Wt (!) 138.2 kg   LMP 07/27/2022   SpO2 100%   BMI 53.96 kg/m   VE 8-9/100/0 Amnioinfusion ongoing ? OT  Tracing. Baseline 150  fair variability (+) small accel Ctx q 2-3 mins  IMP: active phase IUP@ 38 3/7 SROM Obesity affecting pregnancy P) low dose pitocin. Continue amnioinfusion

## 2023-04-22 NOTE — MAU Note (Addendum)
.  Joy Jordan is a 27 y.o. at [redacted]w[redacted]d here in MAU reporting: water broke at 0315 - clear fluid and continuing to leak. Denies pain, VB, complications/concerns with pregnancy, HA, visual disturbances, epigastric pain, or DFM. GBS negative.   Onset of complaint: 0315 Pain score: 0 Vitals:   04/22/23 0344  BP: (!) 146/84  Pulse: 90  Resp: 19  Temp: 98.4 F (36.9 C)  SpO2: 99%     FHT: 147 - EFM placed in room  Lab orders placed from triage:  fern, UA

## 2023-04-22 NOTE — Progress Notes (Signed)
Joy Jordan is a 27 y.o. G2P0010 at [redacted]w[redacted]d by LMP admitted for rupture of membranes  Subjective: Chief Complaint  Patient presents with   Rupture of Membranes   Epidural Objective: BP (!) 107/91   Pulse (!) 104   Temp 98.2 F (36.8 C) (Oral)   Resp 16   Ht 5\' 3"  (1.6 m)   Wt (!) 138.2 kg   LMP 07/27/2022   SpO2 100%   BMI 53.96 kg/m  No intake/output data recorded. No intake/output data recorded.  FHT:  FHR: 140 bpm, variability: moderate,  accelerations:  Present,  decelerations:  Absent UC:   irregular, every 2-3 minutes SVE:   4 cm dilated, 100% effaced, -2 station IUPC placed Tracing: cat 1  Labs: Lab Results  Component Value Date   WBC 12.0 (H) 04/22/2023   HGB 9.5 (L) 04/22/2023   HCT 29.5 (L) 04/22/2023   MCV 78.5 (L) 04/22/2023   PLT 282 04/22/2023    Assessment / Plan: SROM   Active  phase Obesity affecting pregnancy IUP @ 38 3/7 wk P) exaggerated positions. Pitocin augmentation prn     Anticipated MOD:  NSVD  Joy Jordan A Holly Iannaccone 04/22/2023, 1:14 PM

## 2023-04-22 NOTE — Progress Notes (Signed)
Pt informed that the ultrasound is considered a limited OB ultrasound and is not intended to be a complete ultrasound exam.  Patient also informed that the ultrasound is not being completed with the intent of assessing for fetal or placental anomalies or any pelvic abnormalities.  Explained that the purpose of today's ultrasound is to assess for  presentation.  Patient acknowledges the purpose of the exam and the limitations of the study.    Cephalic presentation confirmed.  

## 2023-04-23 ENCOUNTER — Encounter (HOSPITAL_COMMUNITY): Payer: Self-pay | Admitting: Obstetrics and Gynecology

## 2023-04-23 MED ORDER — IBUPROFEN 100 MG/5ML PO SUSP
600.0000 mg | Freq: Four times a day (QID) | ORAL | Status: DC
  Administered 2023-04-23 – 2023-04-25 (×7): 600 mg via ORAL
  Filled 2023-04-23 (×8): qty 30

## 2023-04-23 MED ORDER — ACETAMINOPHEN 325 MG PO TABS: 650.0000 mg | ORAL_TABLET | ORAL | Status: DC | PRN

## 2023-04-23 MED ORDER — IBUPROFEN 600 MG PO TABS
600.0000 mg | ORAL_TABLET | Freq: Four times a day (QID) | ORAL | Status: DC
  Filled 2023-04-23: qty 1

## 2023-04-23 MED ORDER — SENNOSIDES-DOCUSATE SODIUM 8.6-50 MG PO TABS
2.0000 | ORAL_TABLET | Freq: Every day | ORAL | Status: DC
  Administered 2023-04-25: 2 via ORAL
  Filled 2023-04-23: qty 2

## 2023-04-23 MED ORDER — PRENATAL MULTIVITAMIN CH
1.0000 | ORAL_TABLET | Freq: Every day | ORAL | Status: DC
  Administered 2023-04-24: 1 via ORAL
  Filled 2023-04-23 (×2): qty 1

## 2023-04-23 MED ORDER — WITCH HAZEL-GLYCERIN EX PADS: 1.0000 | MEDICATED_PAD | CUTANEOUS | Status: DC | PRN

## 2023-04-23 MED ORDER — COCONUT OIL OIL: 1.0000 | TOPICAL_OIL | Status: DC | PRN

## 2023-04-23 MED ORDER — ZOLPIDEM TARTRATE 5 MG PO TABS: 5.0000 mg | ORAL_TABLET | Freq: Every evening | ORAL | Status: DC | PRN

## 2023-04-23 MED ORDER — DIPHENHYDRAMINE HCL 25 MG PO CAPS: 25.0000 mg | ORAL_CAPSULE | Freq: Four times a day (QID) | ORAL | Status: DC | PRN

## 2023-04-23 MED ORDER — BENZOCAINE-MENTHOL 20-0.5 % EX AERO
1.0000 | INHALATION_SPRAY | CUTANEOUS | Status: DC | PRN
  Administered 2023-04-23: 1 via TOPICAL
  Filled 2023-04-23: qty 56

## 2023-04-23 MED ORDER — SIMETHICONE 80 MG PO CHEW: 80.0000 mg | CHEWABLE_TABLET | ORAL | Status: DC | PRN

## 2023-04-23 MED ORDER — DIBUCAINE (PERIANAL) 1 % EX OINT: 1.0000 | TOPICAL_OINTMENT | CUTANEOUS | Status: DC | PRN

## 2023-04-23 MED ORDER — FERROUS SULFATE 325 (65 FE) MG PO TABS
325.0000 mg | ORAL_TABLET | Freq: Two times a day (BID) | ORAL | Status: DC
  Administered 2023-04-23 – 2023-04-25 (×4): 325 mg via ORAL
  Filled 2023-04-23 (×5): qty 1

## 2023-04-23 MED ORDER — ONDANSETRON HCL 4 MG/2ML IJ SOLN: 4.0000 mg | INTRAMUSCULAR | Status: DC | PRN

## 2023-04-23 MED ORDER — ONDANSETRON HCL 4 MG PO TABS: 4.0000 mg | ORAL_TABLET | ORAL | Status: DC | PRN

## 2023-04-23 NOTE — Progress Notes (Signed)
PPD{NUMBERS 1-5:20334} SVD:   S:  Pt reports feeling ***/ Tolerating po/ Voiding without problems/ No n/v/ Bleeding is {Description; bleeding vaginal:11356}/ Pain controlled with{treatments; pain control med:13496}  Newborn info ***   O:  A & O x 3 ***/ VS: Blood pressure 126/84, pulse (!) 102, temperature 98 F (36.7 C), temperature source Oral, resp. rate 17, height 5\' 3"  (1.6 m), weight (!) 138.2 kg, last menstrual period 07/27/2022, SpO2 100 %, unknown if currently breastfeeding.  LABS: No results found for this or any previous visit (from the past 24 hour(s)).  I&O: I/O last 3 completed shifts: In: 5828.7 [I.V.:5664.5; Other:164.2] Out: 1764 [Urine:700; Blood:1064]   No intake/output data recorded.  Lungs: {Exam; lungs:5033}  Heart: {Exam; heart:5510}  Abdomen: {PE ABDOMEN POSTPARTUM OBGYN:313106}  Perineum: {exam; perineum:10172}  Lochia: ***  Extremities:{pe extremities ZO:109604}    A/P: PPD # ***/ [id number]  Doing well  Continue routine post partum orders  ***

## 2023-04-23 NOTE — Lactation Note (Addendum)
This note was copied from a baby's chart. Lactation Consultation Note  Patient Name: Joy Jordan [id number] Date: 04/23/2023 Age:29 hours Reason for consult: Initial assessment  P1, Mother states she currently wants to only formula feed but is undecided about whether she wants to breastfeed for not.  Suggest that she will need to stimulate her breasts if she desires to offer breastmilk to her baby.  Suggest calling for [facility] assistance if she decided.    Feeding Mother's Current Feeding Choice: Formula  Interventions Interventions: Education   Consult Status Consult Status: PRN    Hardie Pulley 04/23/2023, 11:00 AM

## 2023-04-23 NOTE — Anesthesia Postprocedure Evaluation (Signed)
Anesthesia Post Note  Patient: Joy Jordan  Procedure(s) Performed: AN AD HOC LABOR EPIDURAL     Patient location during evaluation: Mother Baby Anesthesia Type: Epidural Level of consciousness: awake, oriented and awake and alert Pain management: pain level controlled Vital Signs Assessment: post-procedure vital signs reviewed and stable Respiratory status: spontaneous breathing, respiratory function stable and nonlabored ventilation Cardiovascular status: stable Postop Assessment: no headache, adequate PO intake, patient able to bend at knees, no backache and no apparent nausea or vomiting (Patient hasn't ambulated yet but can move and bend knees. She said legs feel normal.) Anesthetic complications: no   No notable events documented.  Last Vitals:  Vitals:   04/23/23 0320 04/23/23 0424  BP: (!) 110/58 124/71  Pulse: (!) 113 (!) 106  Resp: 20 19  Temp: 36.8 C 36.8 C  SpO2:  100%    Last Pain:  Vitals:   04/23/23 0424  TempSrc: Oral  PainSc:    Pain Goal:                   [name]

## 2023-04-24 LAB — CBC
HCT: 21.5 % — ABNORMAL LOW (ref 36.0–46.0)
Hemoglobin: 6.6 g/dL — CL (ref 12.0–15.0)
MCH: 24.5 pg — ABNORMAL LOW (ref 26.0–34.0)
MCHC: 30.7 g/dL (ref 30.0–36.0)
MCV: 79.9 fL — ABNORMAL LOW (ref 80.0–100.0)
Platelets: 222 10*3/uL (ref 150–400)
RBC: 2.69 MIL/uL — ABNORMAL LOW (ref 3.87–5.11)
RDW: 14.8 % (ref 11.5–15.5)
WBC: 14.1 10*3/uL — ABNORMAL HIGH (ref 4.0–10.5)
nRBC: 0 % (ref 0.0–0.2)

## 2023-04-24 LAB — BIRTH TISSUE RECOVERY COLLECTION (PLACENTA DONATION)

## 2023-04-24 MED ORDER — SODIUM CHLORIDE 0.9 % IV SOLN
500.0000 mg | Freq: Once | INTRAVENOUS | Status: AC
  Administered 2023-04-24: 500 mg via INTRAVENOUS
  Filled 2023-04-24: qty 25

## 2023-04-25 MED ORDER — POLYSACCHARIDE IRON COMPLEX 150 MG PO CAPS: 150.0000 mg | ORAL_CAPSULE | Freq: Every day | ORAL | 2 refills | Status: AC

## 2023-04-25 NOTE — Discharge Summary (Signed)
Postpartum Discharge Summary  Date of Service updated     Patient Name: Joy Jordan DOB: 04/11/1996 MRN: [id number]  Date of admission: 04/22/2023 Delivery date:04/23/2023  Delivering provider: Halvor Behrend  Date of discharge: 04/25/2023  Admitting diagnosis: [facility] Intrauterine pregnancy: [redacted]w[redacted]d     Secondary diagnosis:  Principal Problem:   Indication for care or intervention in labor or delivery Active Problems:   Vacuum-assisted vaginal delivery  Additional problems: obesity affecting pregnancy    Discharge diagnosis: Term Pregnancy Delivered and IDA, obesity affecting pregnancy, variable decelerations                                               Post partum procedures: IV iron infusion Augmentation: Pitocin Complications: None  Hospital course: Onset of Labor With Vaginal Delivery      27 y.o. yo G2P1011 at [redacted]w[redacted]d was admitted in [facility] on 04/22/2023 not in labor. Oral and vaginal cytotec was given. Epidural for pain mgmt. Low dose pitocin for augmentation. . Labor course was complicated by variable decelerations with result use of amnioinfusion  Membrane Rupture Time/Date: 3:15 AM ,04/22/2023   Delivery Method:Vaginal, Vacuum (Extractor)  Episiotomy: None  Lacerations:  2nd degree;Sulcus;Perineal;Vaginal  Patient had a postpartum course complicated by  IDA due to acute blood loss. Pt was .  She is ambulating, tolerating a regular diet, passing flatus, and urinating well. Patient is discharged home in stable condition on 04/25/2023  Newborn Data: Birth date:04/23/2023  Birth time:12:53 AM  Gender:Female  Living status:Living  Apgars:8 ,9  Weight:2.92 kg   Magnesium Sulfate received: No BMZ received: No Rhophylac:No MMR:No T-DaP:Given prenatally Flu: No Transfusion:No  Physical exam  Vitals:   04/24/23 1415 04/24/23 1530 04/24/23 2056 04/25/23 0533  BP: 118/80  127/73 131/81  Pulse: (!) 107 98 (!) 117 (!) 106  Resp: 15  16 18   Temp: 98.2 F (36.8 C)  98.2 F  (36.8 C) 98.4 F (36.9 C)  TempSrc: Oral  Oral Oral  SpO2:      Weight:      Height:       General: alert, cooperative, and no distress Lochia: appropriate Uterine Fundus: firm Incision: N/A DVT Evaluation: No evidence of DVT seen on physical exam. Calf/Ankle edema is present Labs: Lab Results  Component Value Date   WBC 14.1 (H) 04/24/2023   HGB 6.6 (LL) 04/24/2023   HCT 21.5 (L) 04/24/2023   MCV 79.9 (L) 04/24/2023   PLT 222 04/24/2023      Latest Ref Rng & Units 09/22/2021    5:37 PM  CMP  Glucose 70 - 99 mg/dL 87   BUN 6 - 20 mg/dL 11   Creatinine 4.09 - 1.00 mg/dL 8.11   Sodium 914 - 782 mmol/L 139   Potassium 3.5 - 5.1 mmol/L 3.4   Chloride 98 - 111 mmol/L 108   CO2 22 - 32 mmol/L 26   Calcium 8.9 - 10.3 mg/dL 8.3   Total Protein 6.5 - 8.1 g/dL 7.3   Total Bilirubin 0.3 - 1.2 mg/dL 0.3   Alkaline Phos 38 - 126 U/L 47   AST 15 - 41 U/L 14   ALT 0 - 44 U/L 12    Edinburgh Score:    04/23/2023    3:20 AM  Edinburgh Postnatal Depression Scale Screening Tool  I have been able to laugh and see  the funny side of things. 0  I have looked forward with enjoyment to things. 0  I have blamed myself unnecessarily when things went wrong. 0  I have been anxious or worried for no good reason. 0  I have felt scared or panicky for no good reason. 0  Things have been getting on top of me. 1  I have been so unhappy that I have had difficulty sleeping. 0  I have felt sad or miserable. 0  I have been so unhappy that I have been crying. 0  The thought of harming myself has occurred to me. 0  Edinburgh Postnatal Depression Scale Total 1      After visit meds:  Allergies as of 04/25/2023   No Known Allergies      Medication List     TAKE these medications    iron polysaccharides 150 MG capsule Commonly known as: NIFEREX Take 1 capsule (150 mg total) by mouth daily.   prenatal multivitamin Tabs tablet Take 1 tablet by mouth daily.         Discharge home in  stable condition Infant Feeding: Bottle and Breast Infant Disposition:home with mother Discharge instruction: per After Visit Summary and Postpartum booklet. Activity: Advance as tolerated. Pelvic rest for 6 weeks.  Diet: iron rich diet Anticipated Birth Control: Unsure Postpartum Appointment:6 weeks Additional Postpartum F/U:  n/a Future Appointments:No future appointments. Follow up Visit:  Follow-up Information     Maxie Better, MD Follow up in 6 week(s).   Specialty: Obstetrics and Gynecology Contact information: [address] [place] [address] [phone]                     04/25/2023 Serita Kyle, MD

## 2023-04-25 NOTE — Discharge Instructions (Signed)
Call if temperature greater than equal to 100.4, nothing per vagina for 4-6 weeks or severe nausea vomiting, increased incisional pain , drainage or redness in the incision site, no straining with bowel movements, showers no bath °

## 2023-04-25 NOTE — Progress Notes (Signed)
PPD2 SVD:   S:  Pt reports feeling well/ Tolerating po/ Voiding without problems/ No n/v/ Bleeding is light/ Pain controlled withprescription NSAID's including ibuprofen (Motrin)  Newborn info live female   O:  A & O x 3 / VS: Blood pressure 131/81, pulse (!) 106, temperature 98.4 F (36.9 C), temperature source Oral, resp. rate 18, height 5\' 3"  (1.6 m), weight (!) 138.2 kg, last menstrual period 07/27/2022, SpO2 100 %, unknown if currently breastfeeding.  LABS: No results found for this or any previous visit (from the past 24 hour(s)).  I&O: No intake/output data recorded.   No intake/output data recorded.  Lungs: chest clear, no wheezing, rales, normal symmetric air entry  Heart: regular rate and rhythm, S1, S2 normal, no murmur, click, rub or gallop  Abdomen: uterus firm @ umb. obese  Perineum: not inspected  Lochia: light  Extremities:no redness or tenderness in the calves or thighs, edema tr+    A/P: PPD # 2/ [id number] IDA.  Asx.  Doing well  Continue routine post partum orders  D/c instructions reviewed.  F/u 6 wk  Continue iron supplement

## 2023-04-25 NOTE — Progress Notes (Signed)
PPD 1SVD:   S:  Pt reports feeling well/ Tolerating po/ Voiding without problems/ No n/v/ Bleeding is light/ Pain controlled withprescription NSAID's including ibuprofen (Motrin)  Newborn info live female   O:  A & O x 3 / VS:   LABS: No results found for this or any previous visit (from the past 24 hour(s)).  I&O: No intake/output data recorded.   No intake/output data recorded.  Lungs: chest clear, no wheezing, rales, normal symmetric air entry  Heart: regular rate and rhythm, S1, S2 normal, no murmur, click, rub or gallop  Abdomen: gravid soft  Perineum: not inspected  Lochia: light  Extremities:no redness or tenderness in the calves or thighs, edema tr+    A/P: PPD # 1/ G2P1011 IDA exacerbated by acute blood loss s/p IV iron infusion. asx  Doing well  Continue routine post partum orders Anticipated d/c in am

## 2023-05-03 ENCOUNTER — Telehealth (HOSPITAL_COMMUNITY): Payer: Self-pay | Admitting: *Deleted

## 2023-05-03 ENCOUNTER — Inpatient Hospital Stay (HOSPITAL_COMMUNITY): Admission: AD | Admit: 2023-05-03 | Payer: BC Managed Care – PPO | Source: Home / Self Care

## 2023-05-03 NOTE — Telephone Encounter (Signed)
Mom reports feeling good. No concerns about herself at this time. EPDS not completed as mom reports feeling well emotionally. [facility] score=1) Mom reports baby is doing well. Feeding, peeing, and pooping without difficulty. Safe sleep reviewed. Mom reports no concerns about baby at present.  Duffy Rhody, RN 05-03-2023 at 3:28pm
